# Patient Record
Sex: Female | Born: 1970 | Race: White | Hispanic: No | Marital: Married
Health system: Southern US, Community
[De-identification: ages and names within clinical notes are randomized; demographics above are authoritative.]

## PROBLEM LIST (undated history)

## (undated) HISTORY — PX: AUGMENTATION MAMMAPLASTY: SUR837

---

## 1998-08-11 ENCOUNTER — Other Ambulatory Visit: Admission: RE | Admit: 1998-08-11 | Discharge: 1998-08-11 | Payer: Self-pay | Admitting: Obstetrics and Gynecology

## 2000-01-29 ENCOUNTER — Other Ambulatory Visit: Admission: RE | Admit: 2000-01-29 | Discharge: 2000-01-29 | Payer: Self-pay | Admitting: Obstetrics and Gynecology

## 2001-04-09 ENCOUNTER — Other Ambulatory Visit: Admission: RE | Admit: 2001-04-09 | Discharge: 2001-04-09 | Payer: Self-pay | Admitting: Obstetrics and Gynecology

## 2002-05-20 ENCOUNTER — Other Ambulatory Visit: Admission: RE | Admit: 2002-05-20 | Discharge: 2002-05-20 | Payer: Self-pay | Admitting: Obstetrics and Gynecology

## 2003-10-18 ENCOUNTER — Inpatient Hospital Stay (HOSPITAL_COMMUNITY): Admission: RE | Admit: 2003-10-18 | Discharge: 2003-10-21 | Payer: Self-pay | Admitting: Obstetrics and Gynecology

## 2003-11-18 ENCOUNTER — Other Ambulatory Visit: Admission: RE | Admit: 2003-11-18 | Discharge: 2003-11-18 | Payer: Self-pay | Admitting: Obstetrics and Gynecology

## 2007-03-05 ENCOUNTER — Encounter (INDEPENDENT_AMBULATORY_CARE_PROVIDER_SITE_OTHER): Payer: Self-pay | Admitting: Obstetrics and Gynecology

## 2007-03-05 ENCOUNTER — Inpatient Hospital Stay (HOSPITAL_COMMUNITY): Admission: RE | Admit: 2007-03-05 | Discharge: 2007-03-07 | Payer: Self-pay | Admitting: Obstetrics and Gynecology

## 2010-06-05 NOTE — Op Note (Signed)
Kellie, Hoffman NO.:  [id number]   MEDICAL RECORD NO.:  [id number]          PATIENT TYPE:  INP   LOCATION:  9127                          FACILITY:  [facility]   PHYSICIAN:  Kendra H. Tenny Craw, MD     DATE OF BIRTH:  02-07-1970   DATE OF PROCEDURE:  03/05/2007  DATE OF DISCHARGE:                               OPERATIVE REPORT   PREOPERATIVE DIAGNOSIS:  1. A 39 and 2-week intrauterine pregnancy.  2. History of prior C-section x2.  3. Desired permanent sterilization.   POSTOPERATIVE DIAGNOSIS:  1. A 39 and 2-week intrauterine pregnancy.  2. History of prior C-section x2.  3. Desired permanent sterilization.   PROCEDURES:  1. Repeat low transverse cesarean section.  2. Lysis of adhesions.  3. Bilateral partial salpingectomy with a modified Pomeroy procedure.   SURGEON:  Freddrick March. Tenny Craw, MD   ASSISTANT:  Dr. Lodema Hong   ANESTHESIA:  Spinal.   OPERATIVE FINDINGS:  A vigorous female infant in the vertex presentation  weighing 7 pounds 7 ounces with Apgar scores of 9 at one minute and 10  and five minutes.  Normal-appearing ovaries and tubes.   SPECIMENS:  Placenta to L&D for disposal and bilateral tubal segments to  pathology.   ESTIMATED BLOOD LOSS:  800 mL.   COMPLICATIONS:  None.   DESCRIPTION OF PROCEDURE:  Kellie Hoffman is a 40 year old G3 P-2-0-0-2 who  presents today at 54 weeks and 2 days estimated gestational age for  repeat cesarean section, and bilateral partial salpingectomy for desired  permanent sterilization.   Following the appropriate informed consent, the patient was brought to  the operating room where spinal anesthesia was administered, and found  to be adequate.  She was placed in the dorsal supine position in a  leftward tilt, prepped and draped in the normal sterile fashion.  A  scalpel was then used to make a Pfannenstiel skin incision which was  carried down through the underlying layers of soft tissue to the fascia.  The fascia  was incised in the midline.  There was noted to be a  significant amount of scarring involving the anterior fascia, and the  underlying rectus muscle; and this was dissected off sharply with the  electrocautery unit superiorly and inferiorly.  The abdominal peritoneum  was then entered sharply using hemostats to identify the peritoneum,  tenting it up and then entering it sharply with the Metzenbaum scissors.  The incision was then extended superiorly and inferiorly with good  visualization of the bladder.  The peritoneum was then stretched, and  the bladder blade was inserted.   The vesicouterine peritoneum was then identified, tented up, entered  sharply with the Metzenbaums, and the incision was then extended  laterally with the Metzenbaums and the bladder flap was created  digitally.  A small approximately 1 x 1 cm window was noted in the lower  uterine segment at the time of bladder flap creation.  A scalpel was  then used to make a low-transverse incision on the uterus about 2 cm  above this defect in the lower uterine segment.  This incision was  then  extended laterally with blunt dissection.  The fetal vertex was then  identified, and brought up to the uterine incision.  The infant's head  was delivered with assistance of the vacuum.  The infant was then bulb  suctioned on the operative field.  The body was completely delivered.  The infant cried vigorously after delivery on the operative field.  The  cord was clamped and cut, and the infant was passed to the waiting  pediatricians.   The placenta was then spontaneously delivered.  The uterus was then  exteriorized and cleared of all clot and debris.  The uterine incision  was repaired with a #1 chromic in a running locked fashion.  The lower  uterine segment was noted to be quite thin, and in several areas needed  to be reinforced with several figure-of-eight sutures.  Prior to  completion of repair of the uterine incision, the  bladder was further  dissected off sharply with Metzenbaum scissors from the lower uterine  segment.   At this time, attention was turned to the fallopian tubes.  A Babcock  clamp was used to grasp the right fallopian tube that was tented up, and  a loop of tube was then suture ligated with 2-0 plain gut.  A second  suture was tied beneath this, and the intervening tubal segment was  excised in a Pomeroy fashion.  Both tubal ostia were noted.   Attention was then turned to the left tube where the same procedure was  repeated on the left side.  Upon inspection of the tube on the right,  there was noted to be some bleeding just inferior to where the tube was  tied off.  This was coagulated with the electrocautery unit.  Some  bleeding was also noted on the left side, involving the mesosalpinx, and  this was suture ligated to achieve hemostasis.   The uterus was then returned to the abdominal cavity.  The abdominal  cavity was cleared of all clot and debris.  The abdominal peritoneum was  then repaired with 2-0 Vicryl in a running fashion, and the muscles were  reapproximated with two figure-of-eight sutures of #1 chromic, and the  fascia was closed with #0 looped PDS.  All sponge, lap, and needle  counts were correct x2.  The patient tolerated the procedure well and  was brought to the recovery room in stable condition following the  procedure.      Freddrick March. Tenny Craw, MD  Electronically Signed     K[facility]/MEDQ  D:  03/05/2007  T:  03/06/2007  Job:  [id number]

## 2010-06-08 NOTE — Op Note (Signed)
Kellie Hoffman, Kellie Hoffman NO.:  [id number]   MEDICAL RECORD NO.:  [id number]          PATIENT TYPE:  INP   LOCATION:  9113                          FACILITY:  [facility]   PHYSICIAN:  Miguel Aschoff, M.D.       DATE OF BIRTH:  01-03-1971   DATE OF PROCEDURE:  10/18/2003  DATE OF DISCHARGE:                                 OPERATIVE REPORT   PREOPERATIVE DIAGNOSIS:  Intrauterine pregnancy at term, for elective repeat  cesarean section.   POSTOPERATIVE DIAGNOSIS:  Intrauterine pregnancy at term, for elective  repeat cesarean section, with delivery of a viable female infant, Apgar 8 and  9.   PROCEDURE:  Repeat low flap transverse cesarean section.   SURGEON:  Miguel Aschoff, M.D.   ASSISTANT:  Carrington Clamp, M.D.   ANESTHESIA:  Spinal.   JUSTIFICATION:  The patient is a 40 year old white female, gravida 2, para 1-  0-0-1, status post previous cesarean section for a breech presentation.  She  is now at 23 weeks' gestation and is requesting elective repeat cesarean  section.  She declines an attempted VBAC.  The risks and limitations of the  procedure were discussed with the patient, and informed consent has been  obtained.   PROCEDURE:  The patient was taken to the operating room and placed in the  supine position after satisfactory level of spinal anesthesia was achieved.  She was prepped and draped in the usual sterile fashion.  A Foley catheter  was inserted.  Her previous Pfannenstiel incision was then reincised.  The  incision was extended down through the subcutaneous tissue with bleeding  points being clamped and coagulated as they were encountered.  The fascia  was then identified and incised transversely and then separated from the  underlying rectus muscles.  The rectus muscles were divided in the midline.  The peritoneum was then found and entered, carefully avoiding the underlying  structures.  The peritoneal incision was then extended under direct  visualization.  Bladder flap was then created and protected with the bladder  blade.  Then an elliptical transverse incision was made into the lower  uterine segment, the amniotic cavity was entered, and clear fluid was  obtained.  The baby was then delivered as a vertex LOA presentation with the  assistance of the vacuum extractor.  The baby was handed to the pediatric  team in attendance.  The Apgar score at one minute was 8 and at five minutes  was 9.  The placenta was then delivered and the uterus was then evacuated of  any remaining products of conception.  The angles of the uterine incision  were then identified and ligated using figure-of-eight sutures of #1 Vicryl.  Then the uterus was closed in layers.  The first layer was a running  interlocking suture of #1 Vicryl, followed by an imbricating suture of #1  Vicryl.  The bladder flap was reapproximated using running continuous 2-0  Vicryl suture.  At this point lap counts were taken and found to be correct.  The abdomen was irrigated with warm saline and then the abdomen was closed.  The parietal  peritoneum was closed using running continuous 0 Vicryl suture,  rectus muscles were reapproximated using running continuous 0 Vicryl suture,  the fascia was closed using two sutures of 0 Vicryl, each starting at the  lateral fascial angles and  meeting in the midline.  Subcutaneous tissue and skin were closed using  staples.  The estimated blood loss was approximately 600 mL.  The patient  tolerated the procedure well and went to the recovery room in satisfactory  condition.      AR/MEDQ  D:  10/18/2003  T:  10/19/2003  Job:  [id number]

## 2010-06-08 NOTE — Discharge Summary (Signed)
Kellie Hoffman, HOOTS NO.:  [id number]   MEDICAL RECORD NO.:  [id number]          PATIENT TYPE:  INP   LOCATION:  9127                          FACILITY:  [facility]   PHYSICIAN:  Malva Limes, M.D.    DATE OF BIRTH:  06-May-1970   DATE OF ADMISSION:  03/05/2007  DATE OF DISCHARGE:  03/07/2007                               DISCHARGE SUMMARY   FINAL DIAGNOSES:  1. Intrauterine pregnancy at 39-2/[redacted] weeks gestation.  2. History of prior cesarean section x2.  3. The patient desires permanent sterilization.   PROCEDURE:  Repeat low transverse cesarean section and bilateral partial  salpingectomy using a modified Pomeroy procedure, lysis of adhesions.  Surgeon - Dr. Waynard Reeds. Assistant - Dr. Lodema Hong.  Complications -  none.   This 40 year old G3, P 2-0-0-2, presents at 39-2/[redacted] weeks gestation for  repeat cesarean section.  The patient had two prior cesarean sections  and is scheduled for a repeat with this pregnancy, as well.  She also  desires permanent sterilization and has been counseled on the pros and  cons of that procedure, as well.  The patient's antepartum course up to  this point had been relatively uncomplicated.  The patient is advanced  maternal age but declined any genetic screening.  Otherwise, has had an  uncomplicated antepartum course.   The patient is currently taken to the operating room on March 05, 2007 by Dr. Waynard Reeds where a repeat low transverse cesarean section  was performed with the delivery of a 7 pound, 7 ounces female infant  with Apgars of 9 and 10.  Delivery went without complications.  The  patient still expressed her desire for permanent sterilization which was  performed using a modified Pomeroy procedure.  There were some adhesions  that were lysed, as well, and the procedures all went without  complications.  The patient's postoperative course was benign without  any significant fevers.  The patient was felt ready for an  early  discharge on postoperative day #2.  She was sent home on a regular diet,  told to decrease her activities, told to continue her vitamins.  She was  sent home on some pain medicines as directed by Dr. Malva Limes.  They  are not in the chart exactly what she was prescribed, but it does look  like instructions and precautions were reviewed with the patient.  She  was to follow up in our office in 4 weeks.   LABORATORIES ON DISCHARGE:  The patient had a hemoglobin of 10.8, white  blood cell count of 8.2 and platelets of 162,000.      Leilani Able, P.A.-C.    ______________________________  Malva Limes, M.D.    MB/MEDQ  D:  03/31/2007  T:  04/01/2007  Job:  [id number]

## 2010-06-08 NOTE — Discharge Summary (Signed)
NAMEBRYNNLIE, UNTERREINER NO.:  [id number]   MEDICAL RECORD NO.:  [id number]          PATIENT TYPE:  INP   LOCATION:  9113                          FACILITY:  [facility]   PHYSICIAN:  Randye Lobo, M.D.   DATE OF BIRTH:  July 08, 1970   DATE OF ADMISSION:  10/18/2003  DATE OF DISCHARGE:  10/21/2003                                 DISCHARGE SUMMARY   FINAL DIAGNOSES:  1.  Intrauterine pregnancy at term.  2.  Elective repeat cesarean section.   PROCEDURE:  Repeat low flap transverse cesarean section.  Surgeon:  Dr.  Miguel Aschoff.  Assistant:  Dr. Carrington Clamp.  Complications:  None.   This 40 year old G2 P1-0-0-1 presents at term for an elective repeat  cesarean section.  The patient had a previous cesarean section with her  first pregnancy secondary to a breech presentation.  She declines an attempt  at [facility] and is here for her cesarean section.  The patient's antepartum  course up to this point has been uncomplicated.  She had normal 1-hour sugar  test and negative group B strep culture obtained in the office at 35 weeks.  She was taken to the operating room on October 18, 2003 by Dr. Miguel Aschoff  where a repeat low transverse cesarean section was performed with the  delivery of an 8-pound 1-ounce female infant with Apgars of 8 and 9.  Delivery  went without complications.  The patient's postoperative course was benign  without significant fevers.  She was felt ready for discharge on  postoperative day #3.  She was sent home on a regular diet, told to decrease  activities, told to continue her prenatal vitamins, was given a prescription  for Percocet one to two q.4h. as needed for pain, told she could use  ibuprofen up to 600 mg q.6h. for pain, was to follow up in the office in 4  weeks.   LABORATORY DATA ON DISCHARGE:  The patient had a hemoglobin of 11.5, white  blood cell count of 7.2.     [place]   MB/MEDQ  D:  11/10/2003  T:  11/10/2003  Job:  [id number]

## 2010-08-15 ENCOUNTER — Other Ambulatory Visit: Payer: Self-pay | Admitting: Obstetrics and Gynecology

## 2010-10-12 LAB — CBC
HCT: 30.3 — ABNORMAL LOW
HCT: 37.5
Hemoglobin: 10.8 — ABNORMAL LOW
MCHC: 35.5
MCHC: 35.7
MCV: 96.8
MCV: 97.2
Platelets: 210
RBC: 3.12 — ABNORMAL LOW
RDW: 13.1

## 2013-02-17 ENCOUNTER — Other Ambulatory Visit: Payer: Self-pay | Admitting: Obstetrics and Gynecology

## 2013-02-17 DIAGNOSIS — R928 Other abnormal and inconclusive findings on diagnostic imaging of breast: Secondary | ICD-10-CM

## 2013-03-02 ENCOUNTER — Other Ambulatory Visit: Payer: Self-pay | Admitting: Obstetrics and Gynecology

## 2013-03-02 ENCOUNTER — Ambulatory Visit
Admission: RE | Admit: 2013-03-02 | Discharge: 2013-03-02 | Disposition: A | Discharge status: Home / Self Care | Payer: BC Managed Care – PPO | Source: Ambulatory Visit | Attending: Obstetrics and Gynecology | Admitting: Obstetrics and Gynecology

## 2013-03-02 DIAGNOSIS — R928 Other abnormal and inconclusive findings on diagnostic imaging of breast: Secondary | ICD-10-CM

## 2013-03-05 ENCOUNTER — Other Ambulatory Visit: Payer: BC Managed Care – PPO

## 2013-03-19 ENCOUNTER — Ambulatory Visit
Admission: RE | Admit: 2013-03-19 | Discharge: 2013-03-19 | Disposition: A | Discharge status: Home / Self Care | Payer: BC Managed Care – PPO | Source: Ambulatory Visit | Attending: Obstetrics and Gynecology | Admitting: Obstetrics and Gynecology

## 2013-03-19 ENCOUNTER — Other Ambulatory Visit: Payer: Self-pay | Admitting: Obstetrics and Gynecology

## 2013-03-19 DIAGNOSIS — N6310 Unspecified lump in the right breast, unspecified quadrant: Secondary | ICD-10-CM

## 2013-03-19 DIAGNOSIS — R928 Other abnormal and inconclusive findings on diagnostic imaging of breast: Secondary | ICD-10-CM

## 2013-04-07 ENCOUNTER — Other Ambulatory Visit: Payer: Self-pay | Admitting: Obstetrics and Gynecology

## 2013-10-05 ENCOUNTER — Other Ambulatory Visit: Payer: Self-pay | Admitting: Obstetrics and Gynecology

## 2013-10-05 DIAGNOSIS — N63 Unspecified lump in unspecified breast: Secondary | ICD-10-CM

## 2013-10-22 ENCOUNTER — Ambulatory Visit
Admission: RE | Admit: 2013-10-22 | Discharge: 2013-10-22 | Disposition: A | Discharge status: Home / Self Care | Payer: BC Managed Care – PPO | Source: Ambulatory Visit | Attending: Obstetrics and Gynecology | Admitting: Obstetrics and Gynecology

## 2013-10-22 DIAGNOSIS — N63 Unspecified lump in unspecified breast: Secondary | ICD-10-CM

## 2014-01-20 ENCOUNTER — Other Ambulatory Visit: Payer: Self-pay | Admitting: Obstetrics and Gynecology

## 2014-01-20 DIAGNOSIS — N6310 Unspecified lump in the right breast, unspecified quadrant: Secondary | ICD-10-CM

## 2014-02-16 ENCOUNTER — Ambulatory Visit
Admission: RE | Admit: 2014-02-16 | Discharge: 2014-02-16 | Disposition: A | Discharge status: Home / Self Care | Payer: BC Managed Care – PPO | Source: Ambulatory Visit | Attending: Obstetrics and Gynecology | Admitting: Obstetrics and Gynecology

## 2014-02-16 ENCOUNTER — Other Ambulatory Visit: Payer: Self-pay | Admitting: Obstetrics and Gynecology

## 2014-02-16 DIAGNOSIS — N6310 Unspecified lump in the right breast, unspecified quadrant: Secondary | ICD-10-CM

## 2015-04-05 ENCOUNTER — Other Ambulatory Visit: Payer: Self-pay

## 2015-04-05 DIAGNOSIS — Z1231 Encounter for screening mammogram for malignant neoplasm of breast: Secondary | ICD-10-CM

## 2015-04-25 ENCOUNTER — Ambulatory Visit
Admission: RE | Admit: 2015-04-25 | Discharge: 2015-04-25 | Disposition: A | Discharge status: Home / Self Care | Payer: BC Managed Care – PPO | Source: Ambulatory Visit

## 2015-04-25 DIAGNOSIS — Z1231 Encounter for screening mammogram for malignant neoplasm of breast: Secondary | ICD-10-CM

## 2016-04-01 ENCOUNTER — Other Ambulatory Visit: Payer: Self-pay | Admitting: Obstetrics and Gynecology

## 2016-04-01 DIAGNOSIS — Z1231 Encounter for screening mammogram for malignant neoplasm of breast: Secondary | ICD-10-CM

## 2016-04-26 ENCOUNTER — Ambulatory Visit
Admission: RE | Admit: 2016-04-26 | Discharge: 2016-04-26 | Disposition: A | Discharge status: Home / Self Care | Payer: BC Managed Care – PPO | Source: Ambulatory Visit | Attending: Obstetrics and Gynecology | Admitting: Obstetrics and Gynecology

## 2016-04-26 DIAGNOSIS — Z1231 Encounter for screening mammogram for malignant neoplasm of breast: Secondary | ICD-10-CM

## 2016-04-30 ENCOUNTER — Other Ambulatory Visit: Payer: Self-pay | Admitting: Obstetrics and Gynecology

## 2016-04-30 DIAGNOSIS — R928 Other abnormal and inconclusive findings on diagnostic imaging of breast: Secondary | ICD-10-CM

## 2016-05-03 ENCOUNTER — Ambulatory Visit
Admission: RE | Admit: 2016-05-03 | Discharge: 2016-05-03 | Disposition: A | Discharge status: Home / Self Care | Payer: BC Managed Care – PPO | Source: Ambulatory Visit | Attending: Obstetrics and Gynecology | Admitting: Obstetrics and Gynecology

## 2016-05-03 DIAGNOSIS — R928 Other abnormal and inconclusive findings on diagnostic imaging of breast: Secondary | ICD-10-CM

## 2016-05-06 ENCOUNTER — Other Ambulatory Visit: Payer: Self-pay | Admitting: Obstetrics and Gynecology

## 2016-05-07 LAB — CYTOLOGY - PAP

## 2019-03-08 ENCOUNTER — Other Ambulatory Visit: Payer: Self-pay | Admitting: Obstetrics and Gynecology

## 2019-03-08 DIAGNOSIS — Z1231 Encounter for screening mammogram for malignant neoplasm of breast: Secondary | ICD-10-CM

## 2019-03-28 ENCOUNTER — Ambulatory Visit: Payer: BC Managed Care – PPO | Attending: Internal Medicine

## 2019-03-28 DIAGNOSIS — Z23 Encounter for immunization: Secondary | ICD-10-CM | POA: Insufficient documentation

## 2019-03-28 NOTE — Progress Notes (Signed)
   Covid-19 Vaccination Clinic  Name:  Kellie Hoffman    MRN: [id number] DOB: 01-Oct-1970  03/28/2019  [name] was observed post Covid-19 immunization for 15 minutes without incident. She was provided with Vaccine Information Sheet and instruction to access the V-Safe system.   [name] was instructed to call 911 with any severe reactions post vaccine: Marland Kitchen Difficulty breathing  . Swelling of face and throat  . A fast heartbeat  . A bad rash all over body  . Dizziness and weakness   Immunizations Administered    Name Date Dose VIS Date Route   Pfizer COVID-19 Vaccine 03/28/2019 12:21 PM 0.3 mL 01/01/2019 Intramuscular   Manufacturer: [employer], [employer]   Lot: [id number]   NDC: [address]-1

## 2019-04-07 ENCOUNTER — Other Ambulatory Visit: Payer: Self-pay

## 2019-04-07 ENCOUNTER — Ambulatory Visit
Admission: RE | Admit: 2019-04-07 | Discharge: 2019-04-07 | Disposition: A | Discharge status: Home / Self Care | Payer: BC Managed Care – PPO | Source: Ambulatory Visit | Attending: Obstetrics and Gynecology | Admitting: Obstetrics and Gynecology

## 2019-04-07 DIAGNOSIS — Z1231 Encounter for screening mammogram for malignant neoplasm of breast: Secondary | ICD-10-CM

## 2019-04-18 ENCOUNTER — Ambulatory Visit: Payer: Self-pay | Attending: Internal Medicine

## 2019-04-18 DIAGNOSIS — Z23 Encounter for immunization: Secondary | ICD-10-CM

## 2019-04-18 NOTE — Progress Notes (Signed)
   Covid-19 Vaccination Clinic  Name:  Kellie Hoffman    MRN: [id number] DOB: 1970/04/19  04/18/2019  Kellie Hoffman was observed post Covid-19 immunization for 15 minutes without incident. She was provided with Vaccine Information Sheet and instruction to access the V-Safe system.   Kellie Hoffman was instructed to call 911 with any severe reactions post vaccine: Marland Kitchen Difficulty breathing  . Swelling of face and throat  . A fast heartbeat  . A bad rash all over body  . Dizziness and weakness   Immunizations Administered    Name Date Dose VIS Date Route   Pfizer COVID-19 Vaccine 04/18/2019 11:07 AM 0.3 mL 01/01/2019 Intramuscular   Manufacturer: [employer], [employer]   Lot: [id number]   NDC: [address]-8

## 2020-05-30 ENCOUNTER — Other Ambulatory Visit: Payer: Self-pay | Admitting: Obstetrics and Gynecology

## 2020-05-30 DIAGNOSIS — Z1231 Encounter for screening mammogram for malignant neoplasm of breast: Secondary | ICD-10-CM

## 2020-07-26 ENCOUNTER — Ambulatory Visit
Admission: RE | Admit: 2020-07-26 | Discharge: 2020-07-26 | Disposition: A | Discharge status: Home / Self Care | Payer: BC Managed Care – PPO | Source: Ambulatory Visit | Attending: Obstetrics and Gynecology | Admitting: Obstetrics and Gynecology

## 2020-07-26 ENCOUNTER — Other Ambulatory Visit: Payer: Self-pay

## 2020-07-26 DIAGNOSIS — Z1231 Encounter for screening mammogram for malignant neoplasm of breast: Secondary | ICD-10-CM

## 2021-05-04 ENCOUNTER — Other Ambulatory Visit: Payer: Self-pay | Admitting: Obstetrics and Gynecology

## 2021-05-04 DIAGNOSIS — Z1231 Encounter for screening mammogram for malignant neoplasm of breast: Secondary | ICD-10-CM

## 2021-07-27 ENCOUNTER — Ambulatory Visit
Admission: RE | Admit: 2021-07-27 | Discharge: 2021-07-27 | Disposition: A | Discharge status: Home / Self Care | Payer: BC Managed Care – PPO | Source: Ambulatory Visit | Attending: Obstetrics and Gynecology | Admitting: Obstetrics and Gynecology

## 2021-07-27 DIAGNOSIS — Z1231 Encounter for screening mammogram for malignant neoplasm of breast: Secondary | ICD-10-CM

## 2021-07-30 ENCOUNTER — Other Ambulatory Visit: Payer: Self-pay | Admitting: Obstetrics and Gynecology

## 2021-07-30 DIAGNOSIS — R928 Other abnormal and inconclusive findings on diagnostic imaging of breast: Secondary | ICD-10-CM

## 2021-08-20 ENCOUNTER — Ambulatory Visit: Payer: BC Managed Care – PPO

## 2021-08-20 ENCOUNTER — Ambulatory Visit
Admission: RE | Admit: 2021-08-20 | Discharge: 2021-08-20 | Disposition: A | Discharge status: Home / Self Care | Payer: BC Managed Care – PPO | Source: Ambulatory Visit | Attending: Obstetrics and Gynecology | Admitting: Obstetrics and Gynecology

## 2021-08-20 ENCOUNTER — Ambulatory Visit: Admission: RE | Admit: 2021-08-20 | Payer: BC Managed Care – PPO | Source: Ambulatory Visit

## 2021-08-20 DIAGNOSIS — R928 Other abnormal and inconclusive findings on diagnostic imaging of breast: Secondary | ICD-10-CM

## 2022-07-10 ENCOUNTER — Other Ambulatory Visit: Payer: Self-pay | Admitting: Obstetrics and Gynecology

## 2022-07-10 DIAGNOSIS — Z1231 Encounter for screening mammogram for malignant neoplasm of breast: Secondary | ICD-10-CM

## 2022-08-06 ENCOUNTER — Ambulatory Visit: Payer: BC Managed Care – PPO

## 2022-08-23 ENCOUNTER — Ambulatory Visit
Admission: RE | Admit: 2022-08-23 | Discharge: 2022-08-23 | Disposition: A | Discharge status: Home / Self Care | Payer: BC Managed Care – PPO | Source: Ambulatory Visit | Attending: Obstetrics and Gynecology | Admitting: Obstetrics and Gynecology

## 2022-08-23 DIAGNOSIS — Z1231 Encounter for screening mammogram for malignant neoplasm of breast: Secondary | ICD-10-CM

## 2022-10-09 IMAGING — MG DIGITAL SCREENING BREAST BILAT IMPLANT W/ TOMO W/ CAD
8 of 12 series · 8 of 28 positions shown · non-contrast
Comparison: Previous exam(s).

CLINICAL DATA: Screening.

EXAM:
DIGITAL SCREENING BILATERAL MAMMOGRAM WITH IMPLANTS, CAD AND
TOMOSYNTHESIS
TECHNIQUE: Bilateral screening digital craniocaudal and mediolateral oblique
mammograms were obtained. Bilateral screening digital breast
tomosynthesis was performed. The images were evaluated with
computer-aided detection. Standard and/or implant displaced views
were performed.

[L CC]
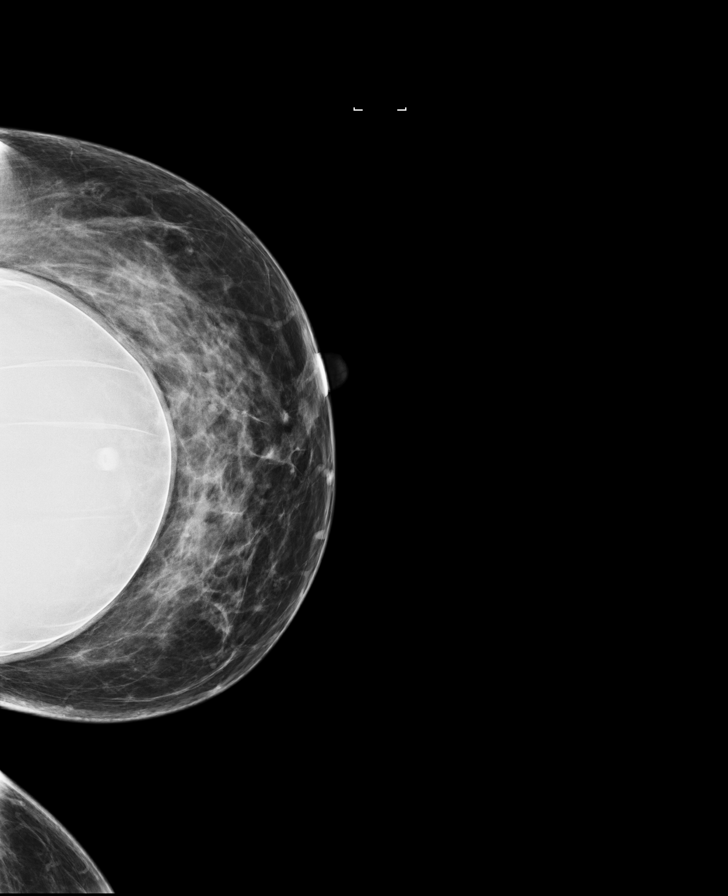

[R CC]
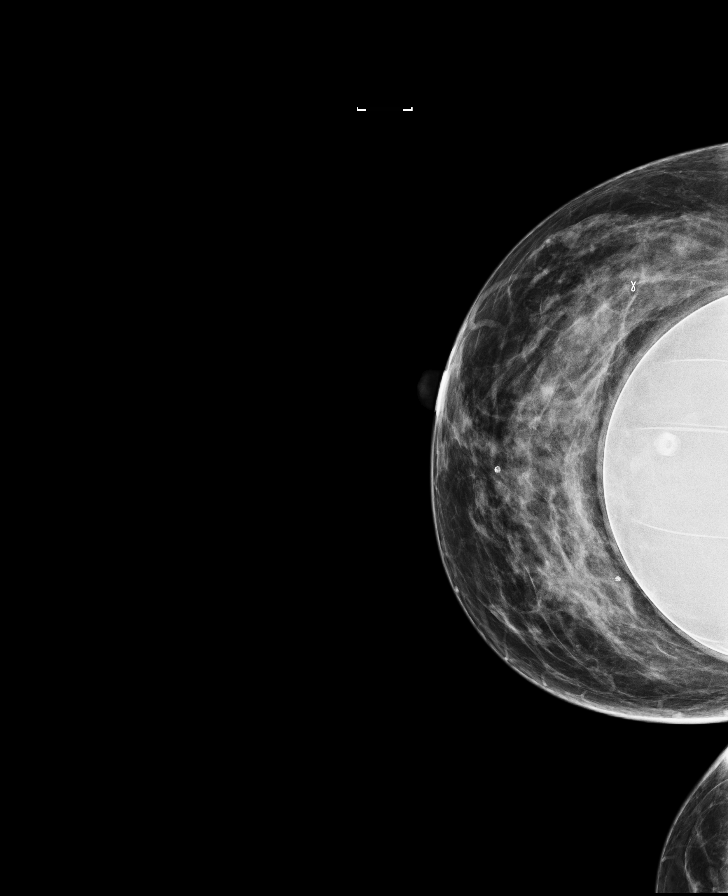

[L MLO]
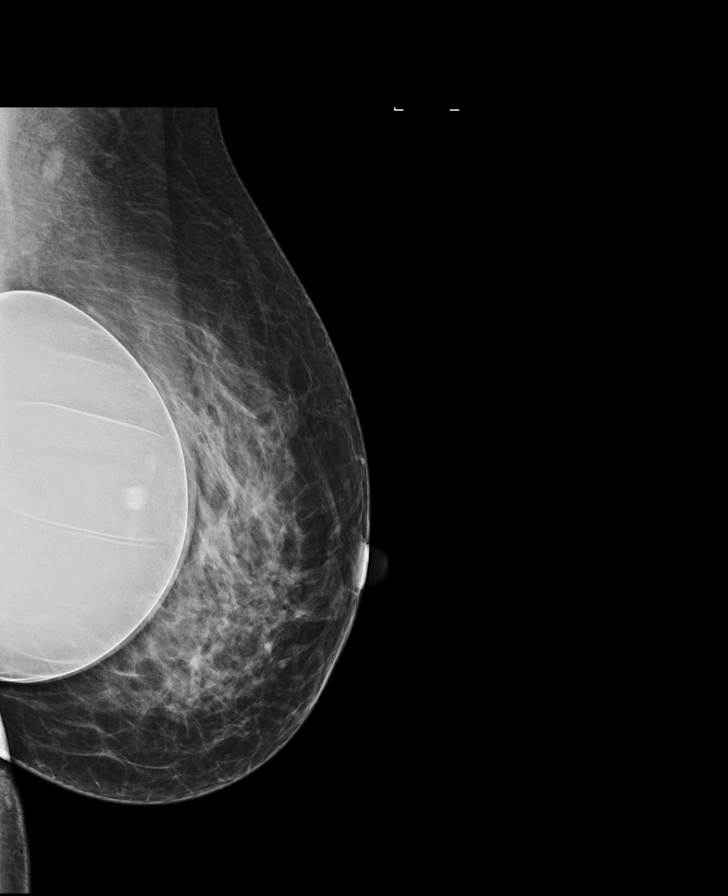

[R MLO]
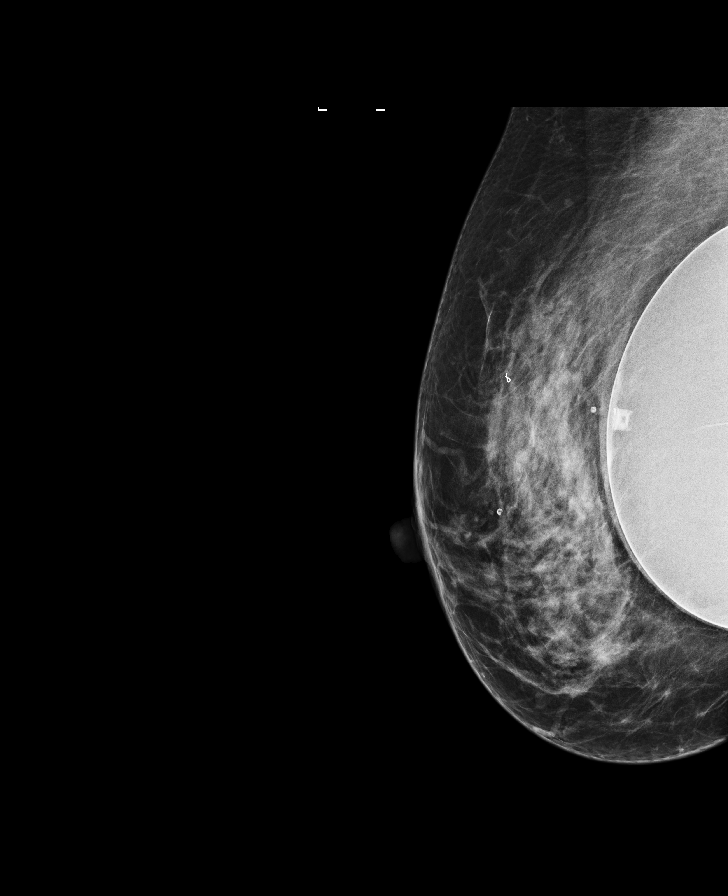

[R CC synth-2D]
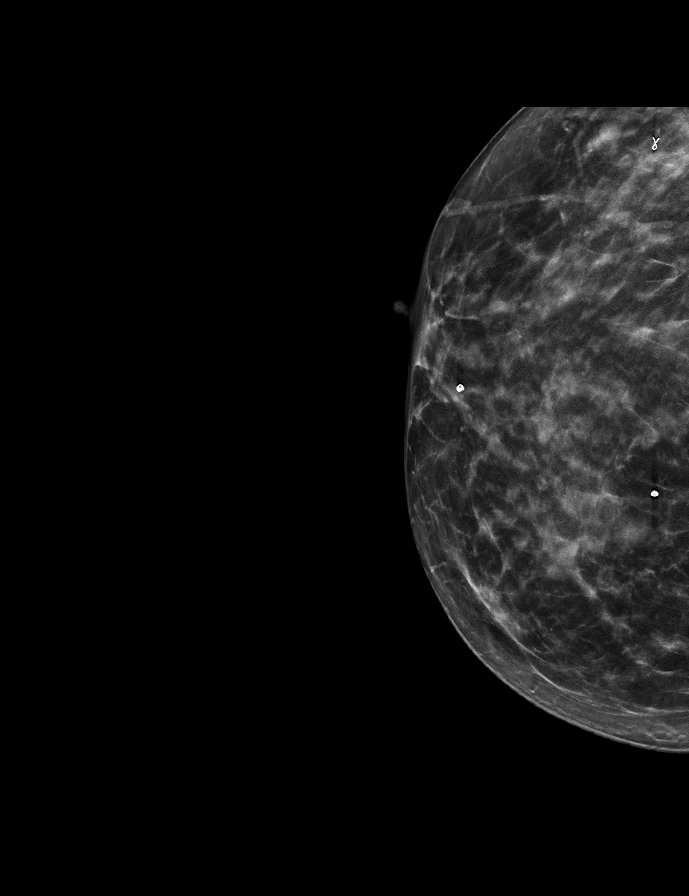

[R MLO synth-2D]
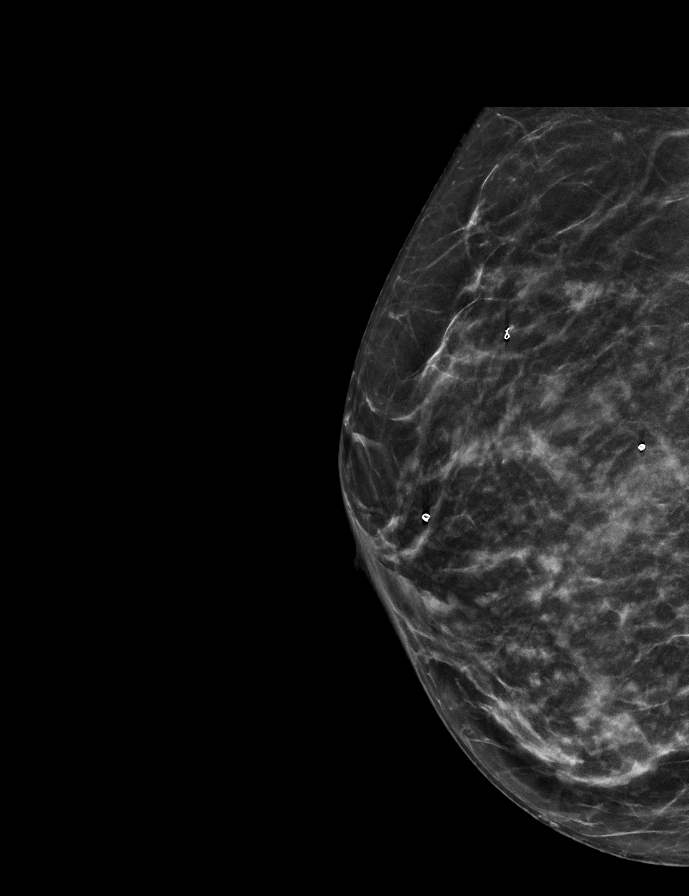

[L CC synth-2D]
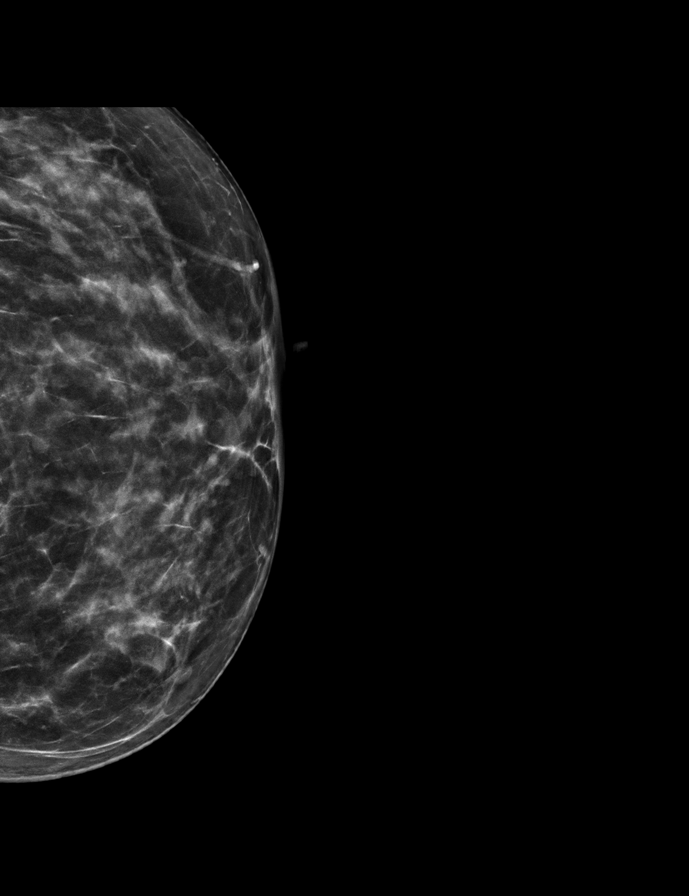

[L MLO synth-2D]
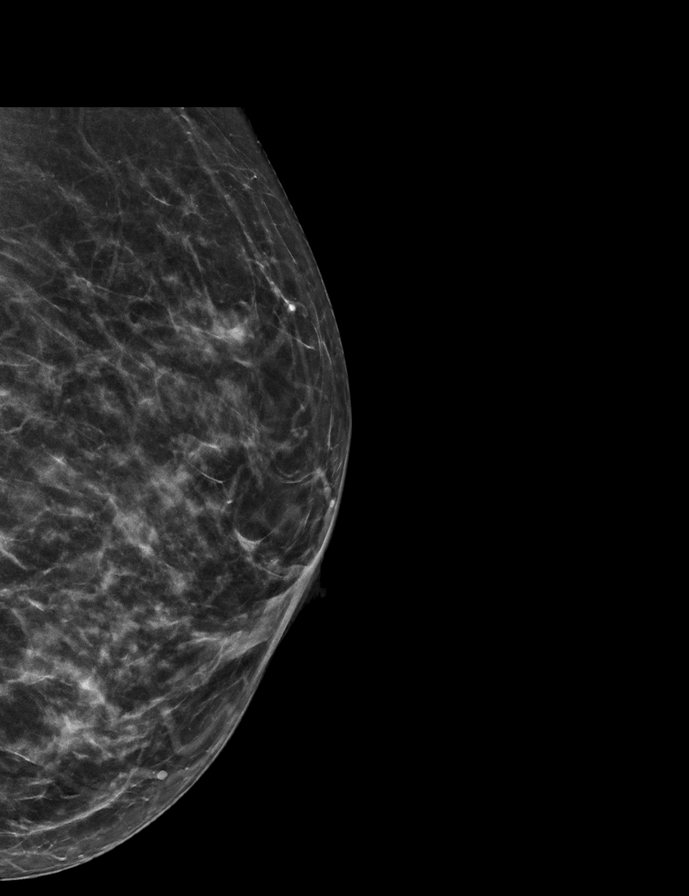

[8 of 28 positions shown; findings below may reference images not displayed]

ACR Breast Density Category c: The breast tissue is heterogeneously
dense, which may obscure small masses.
FINDINGS: The patient has retropectoral implants. There are no findings
suspicious for malignancy.
IMPRESSION: No mammographic evidence of malignancy. A result letter of this
screening mammogram will be mailed directly to the patient.

RECOMMENDATION:
Screening mammogram in one year. (Code:LT-E-7TH)

BI-RADS CATEGORY  1:  Negative.

## 2023-11-13 ENCOUNTER — Other Ambulatory Visit: Payer: Self-pay | Admitting: Obstetrics and Gynecology

## 2023-11-13 DIAGNOSIS — Z1231 Encounter for screening mammogram for malignant neoplasm of breast: Secondary | ICD-10-CM

## 2023-12-04 ENCOUNTER — Ambulatory Visit
Admission: RE | Admit: 2023-12-04 | Discharge: 2023-12-04 | Disposition: A | Source: Ambulatory Visit | Attending: Obstetrics and Gynecology | Admitting: Obstetrics and Gynecology

## 2023-12-04 DIAGNOSIS — Z1231 Encounter for screening mammogram for malignant neoplasm of breast: Secondary | ICD-10-CM

## 2023-12-09 ENCOUNTER — Other Ambulatory Visit: Payer: Self-pay | Admitting: Obstetrics and Gynecology

## 2023-12-09 DIAGNOSIS — R928 Other abnormal and inconclusive findings on diagnostic imaging of breast: Secondary | ICD-10-CM

## 2023-12-26 ENCOUNTER — Encounter

## 2023-12-26 ENCOUNTER — Ambulatory Visit
Admission: RE | Admit: 2023-12-26 | Discharge: 2023-12-26 | Disposition: A | Source: Ambulatory Visit | Attending: Obstetrics and Gynecology | Admitting: Obstetrics and Gynecology

## 2023-12-26 ENCOUNTER — Other Ambulatory Visit

## 2023-12-26 ENCOUNTER — Ambulatory Visit
Admission: RE | Admit: 2023-12-26 | Discharge: 2023-12-26 | Disposition: A | Discharge status: Home / Self Care | Source: Ambulatory Visit | Attending: Obstetrics and Gynecology | Admitting: Obstetrics and Gynecology

## 2023-12-26 DIAGNOSIS — R928 Other abnormal and inconclusive findings on diagnostic imaging of breast: Secondary | ICD-10-CM
